# Patient Record
Sex: Female | Born: 1985 | Marital: Single | Smoking: Never smoker
Health system: Southern US, Community
[De-identification: ages and names within clinical notes are randomized; demographics above are authoritative.]

## PROBLEM LIST (undated history)

## (undated) DIAGNOSIS — R51 Headache: Secondary | ICD-10-CM

## (undated) DIAGNOSIS — N390 Urinary tract infection, site not specified: Secondary | ICD-10-CM

## (undated) DIAGNOSIS — R519 Headache, unspecified: Secondary | ICD-10-CM

## (undated) DIAGNOSIS — K5792 Diverticulitis of intestine, part unspecified, without perforation or abscess without bleeding: Secondary | ICD-10-CM

## (undated) HISTORY — DX: Headache, unspecified: R51.9

## (undated) HISTORY — DX: Diverticulitis of intestine, part unspecified, without perforation or abscess without bleeding: K57.92

## (undated) HISTORY — DX: Urinary tract infection, site not specified: N39.0

## (undated) HISTORY — DX: Headache: R51

---

## 2007-02-27 HISTORY — PX: CHOLECYSTECTOMY: SHX55

## 2013-03-24 ENCOUNTER — Ambulatory Visit (INDEPENDENT_AMBULATORY_CARE_PROVIDER_SITE_OTHER): Payer: 59 | Admitting: Physician Assistant

## 2013-03-24 ENCOUNTER — Other Ambulatory Visit (INDEPENDENT_AMBULATORY_CARE_PROVIDER_SITE_OTHER): Payer: 59

## 2013-03-24 ENCOUNTER — Encounter: Payer: Self-pay | Admitting: Physician Assistant

## 2013-03-24 ENCOUNTER — Telehealth: Payer: Self-pay

## 2013-03-24 VITALS — BP 130/70 | HR 72 | Temp 98.0°F | Resp 18 | Ht 65.0 in | Wt 187.4 lb

## 2013-03-24 DIAGNOSIS — Z0000 Encounter for general adult medical examination without abnormal findings: Secondary | ICD-10-CM

## 2013-03-24 LAB — CBC WITH DIFFERENTIAL/PLATELET
BASOS PCT: 0.4 % (ref 0.0–3.0)
Basophils Absolute: 0 10*3/uL (ref 0.0–0.1)
EOS PCT: 0.5 % (ref 0.0–5.0)
Eosinophils Absolute: 0 10*3/uL (ref 0.0–0.7)
HCT: 42.9 % (ref 36.0–46.0)
Hemoglobin: 14.6 g/dL (ref 12.0–15.0)
LYMPHS PCT: 27.7 % (ref 12.0–46.0)
Lymphs Abs: 1.3 10*3/uL (ref 0.7–4.0)
MCHC: 34.1 g/dL (ref 30.0–36.0)
MCV: 88.6 fl (ref 78.0–100.0)
Monocytes Absolute: 0.3 10*3/uL (ref 0.1–1.0)
Monocytes Relative: 5.9 % (ref 3.0–12.0)
NEUTROS PCT: 65.5 % (ref 43.0–77.0)
Neutro Abs: 3.2 10*3/uL (ref 1.4–7.7)
PLATELETS: 327 10*3/uL (ref 150.0–400.0)
RBC: 4.84 Mil/uL (ref 3.87–5.11)
RDW: 11.8 % (ref 11.5–14.6)
WBC: 4.9 10*3/uL (ref 4.5–10.5)

## 2013-03-24 LAB — HEPATIC FUNCTION PANEL
ALBUMIN: 4.4 g/dL (ref 3.5–5.2)
ALT: 16 U/L (ref 0–35)
AST: 17 U/L (ref 0–37)
Alkaline Phosphatase: 50 U/L (ref 39–117)
Bilirubin, Direct: 0.1 mg/dL (ref 0.0–0.3)
TOTAL PROTEIN: 7.8 g/dL (ref 6.0–8.3)
Total Bilirubin: 1.1 mg/dL (ref 0.3–1.2)

## 2013-03-24 LAB — URINALYSIS, ROUTINE W REFLEX MICROSCOPIC
Bilirubin Urine: NEGATIVE
Ketones, ur: NEGATIVE
Leukocytes, UA: NEGATIVE
Nitrite: NEGATIVE
RBC / HPF: NONE SEEN (ref 0–?)
SPECIFIC GRAVITY, URINE: 1.025 (ref 1.000–1.030)
TOTAL PROTEIN, URINE-UPE24: NEGATIVE
UROBILINOGEN UA: 0.2 (ref 0.0–1.0)
Urine Glucose: NEGATIVE
pH: 6 (ref 5.0–8.0)

## 2013-03-24 LAB — BASIC METABOLIC PANEL
BUN: 10 mg/dL (ref 6–23)
CO2: 26 mEq/L (ref 19–32)
Calcium: 9.5 mg/dL (ref 8.4–10.5)
Chloride: 106 mEq/L (ref 96–112)
Creatinine, Ser: 0.7 mg/dL (ref 0.4–1.2)
GFR: 103.97 mL/min (ref >60.00)
GLUCOSE: 91 mg/dL (ref 70–99)
Potassium: 4.5 mEq/L (ref 3.5–5.1)
Sodium: 138 mEq/L (ref 135–145)

## 2013-03-24 LAB — LIPID PANEL
Cholesterol: 152 mg/dL (ref 0–200)
HDL: 40.2 mg/dL (ref >39.00)
LDL Cholesterol: 81 mg/dL (ref 0–99)
Total CHOL/HDL Ratio: 4
Triglycerides: 156 mg/dL — ABNORMAL HIGH (ref 0.0–149.0)
VLDL: 31.2 mg/dL (ref 0.0–40.0)

## 2013-03-24 LAB — TSH: TSH: 1.38 u[IU]/mL (ref 0.35–5.50)

## 2013-03-24 NOTE — Telephone Encounter (Signed)
Message copied by Basilia JumboALLEN, Isiac Breighner on Tue Mar 24, 2013  1:44 PM ------      Message from: Ascencion DikeHARTMAN, NANCY K      Created: Tue Mar 24, 2013  1:19 PM       Please phone patient and inform her all labs normal. ------

## 2013-03-24 NOTE — Progress Notes (Signed)
Pre-visit discussion using our clinic review tool. No additional management support is needed unless otherwise documented below in the visit note.  

## 2013-03-24 NOTE — Progress Notes (Signed)
   Patient ID: Linda FreestoneKendall Devos is a 28 y.o. female DOB: [id number] MRN: [id number]     HPI patient is a 28 year old female who presents to the clinic to establish care and have physical exam. 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  Reports generally in good health. States diagnosed with diverticulitis in 2009 with occasional flare ups, maybe 3-4 a year. Also reports history of intermittent headaches which respond well to Ibuprofen. No current concerns. Denies chest pain/palpitations, cough, SOB, fever, N/V, change in bowel/bladder habits, visual change/disturbances, numbness or weakness.  ROS: Negative for N/V, diarrhea, constipation, blood in stool/urine, chest pain/palpitations, SOB, cough, fever or fatigue. All other systems negative  PE: CONSTITUTIONAL: Well developed, well nourished, pleasant, appears stated age, in NAD HEENT: normocephalic, atraumatic, bilateral ext/int canals normal. Bilateral TM's without injections, bulging, erythema. Nose normal, uvula midline, oropharynx clear and moist. EYES: PERRLA, bilateral EOM and conjunctiva normal NECK: FROM, supple, without thyromegaly or mass CARDIO: RRR, normal S1 and S2, distal pulses intact. PULM/CHEST CTA bilateral, no wheezes, rales or rhonchi. Non tender. ABD: appearance normal, soft, nontender. Normal bowel sounds x 4 quadrants GU: deferred to GYN MUSC: FROM U/LE bilateral LYMPH: no cervical, supraclavicular adenopathy NEURO: alert and oriented x 3, no cranial nerve deficit, motor strength and coordination NL. Negative romberg. Gait normal. SKIN: warm, dry, no rash or lesions noted. PSYCH: Mood and affect normal, speech normal.  Past Medical History  Diagnosis Date  . Diverticulitis   . HA (headache)   . UTI (urinary tract infection)    Family History  Problem Relation Age of Onset  . Arthritis Mother   . Arthritis Father   . Hyperlipidemia Father   . Heart disease Father   . Stroke Father   .  Hypertension Father   . Kidney disease Father   . Diabetes Father   . Cancer Paternal Grandmother     breast   History   Social History  . Marital Status: Single    Spouse Name: N/A    Number of Children: N/A  . Years of Education: N/A   Social History Main Topics  . Smoking status: Never Smoker   . Smokeless tobacco: None  . Alcohol Use: .5 - 1.5 oz/week    1-3 drink(s) per week     Comment: Social  . Drug Use: No  . Sexual Activity: Yes    Partners: Male    Birth Control/ Protection: Implant   Other Topics Concern  . None   Social History Narrative  . None   Past Surgical History  Procedure Laterality Date  . Cholecystectomy  2009    ASSESSMENT and PLAN   CPX/v70.0 - Patient has been counseled on age-appropriate routine health concerns for screening and prevention. These are reviewed and up-to-date. Immunizations are up-to-date or declined. Labs ordered and will be reviewed.  Patient had concerns for intermittent sinus drainage and ear pressure, currently without symptoms, discussed continued use of Zyrtec and flonase.

## 2013-03-24 NOTE — Patient Instructions (Signed)
It was great meeting you today Linda Dixon!  Labs have been ordered for you, when you report to lab please be fasting.   Health Maintenance, Female A healthy lifestyle and preventative care can promote health and wellness.  Maintain regular health, dental, and eye exams.  Eat a healthy diet. Foods like vegetables, fruits, whole grains, low-fat dairy products, and lean protein foods contain the nutrients you need without too many calories. Decrease your intake of foods high in solid fats, added sugars, and salt. Get information about a proper diet from your caregiver, if necessary.  Regular physical exercise is one of the most important things you can do for your health. Most adults should get at least 150 minutes of moderate-intensity exercise (any activity that increases your heart rate and causes you to sweat) each week. In addition, most adults need muscle-strengthening exercises on 2 or more days a week.   Maintain a healthy weight. The body mass index (BMI) is a screening tool to identify possible weight problems. It provides an estimate of body fat based on height and weight. Your caregiver can help determine your BMI, and can help you achieve or maintain a healthy weight. For adults 20 years and older:  A BMI below 18.5 is considered underweight.  A BMI of 18.5 to 24.9 is normal.  A BMI of 25 to 29.9 is considered overweight.  A BMI of 30 and above is considered obese.  Maintain normal blood lipids and cholesterol by exercising and minimizing your intake of saturated fat. Eat a balanced diet with plenty of fruits and vegetables. Blood tests for lipids and cholesterol should begin at age 37 and be repeated every 5 years. If your lipid or cholesterol levels are high, you are over 50, or you are a high risk for heart disease, you may need your cholesterol levels checked more frequently.Ongoing high lipid and cholesterol levels should be treated with medicines if diet and exercise are not  effective.  If you smoke, find out from your caregiver how to quit. If you do not use tobacco, do not start.  Lung cancer screening is recommended for adults aged 43 80 years who are at high risk for developing lung cancer because of a history of smoking. Yearly low-dose computed tomography (CT) is recommended for people who have at least a 30-pack-year history of smoking and are a current smoker or have quit within the past 15 years. A pack year of smoking is smoking an average of 1 pack of cigarettes a day for 1 year (for example: 1 pack a day for 30 years or 2 packs a day for 15 years). Yearly screening should continue until the smoker has stopped smoking for at least 15 years. Yearly screening should also be stopped for people who develop a health problem that would prevent them from having lung cancer treatment.  If you are pregnant, do not drink alcohol. If you are breastfeeding, be very cautious about drinking alcohol. If you are not pregnant and choose to drink alcohol, do not exceed 1 drink per day. One drink is considered to be 12 ounces (355 mL) of beer, 5 ounces (148 mL) of wine, or 1.5 ounces (44 mL) of liquor.  Avoid use of street drugs. Do not share needles with anyone. Ask for help if you need support or instructions about stopping the use of drugs.  High blood pressure causes heart disease and increases the risk of stroke. Blood pressure should be checked at least every 1 to  2 years. Ongoing high blood pressure should be treated with medicines, if weight loss and exercise are not effective.  If you are 74 to 27 years old, ask your caregiver if you should take aspirin to prevent strokes.  Diabetes screening involves taking a blood sample to check your fasting blood sugar level. This should be done once every 3 years, after age 62, if you are within normal weight and without risk factors for diabetes. Testing should be considered at a younger age or be carried out more frequently if you  are overweight and have at least 1 risk factor for diabetes.  Breast cancer screening is essential preventative care for women. You should practice "breast self-awareness." This means understanding the normal appearance and feel of your breasts and may include breast self-examination. Any changes detected, no matter how small, should be reported to a caregiver. Women in their 70s and 30s should have a clinical breast exam (CBE) by a caregiver as part of a regular health exam every 1 to 3 years. After age 47, women should have a CBE every year. Starting at age 42, women should consider having a mammogram (breast X-ray) every year. Women who have a family history of breast cancer should talk to their caregiver about genetic screening. Women at a high risk of breast cancer should talk to their caregiver about having an MRI and a mammogram every year.  Breast cancer gene (BRCA)-related cancer risk assessment is recommended for women who have family members with BRCA-related cancers. BRCA-related cancers include breast, ovarian, tubal, and peritoneal cancers. Having family members with these cancers may be associated with an increased risk for harmful changes (mutations) in the breast cancer genes BRCA1 and BRCA2. Results of the assessment will determine the need for genetic counseling and BRCA1 and BRCA2 testing.  The Pap test is a screening test for cervical cancer. Women should have a Pap test starting at age 58. Between ages 14 and 63, Pap tests should be repeated every 2 years. Beginning at age 18, you should have a Pap test every 3 years as long as the past 3 Pap tests have been normal. If you had a hysterectomy for a problem that was not cancer or a condition that could lead to cancer, then you no longer need Pap tests. If you are between ages 48 and 67, and you have had normal Pap tests going back 10 years, you no longer need Pap tests. If you have had past treatment for cervical cancer or a condition that  could lead to cancer, you need Pap tests and screening for cancer for at least 20 years after your treatment. If Pap tests have been discontinued, risk factors (such as a new sexual partner) need to be reassessed to determine if screening should be resumed. Some women have medical problems that increase the chance of getting cervical cancer. In these cases, your caregiver may recommend more frequent screening and Pap tests.  The human papillomavirus (HPV) test is an additional test that may be used for cervical cancer screening. The HPV test looks for the virus that can cause the cell changes on the cervix. The cells collected during the Pap test can be tested for HPV. The HPV test could be used to screen women aged 22 years and older, and should be used in women of any age who have unclear Pap test results. After the age of 63, women should have HPV testing at the same frequency as a Pap test.  Colorectal cancer  can be detected and often prevented. Most routine colorectal cancer screening begins at the age of 84 and continues through age 83. However, your caregiver may recommend screening at an earlier age if you have risk factors for colon cancer. On a yearly basis, your caregiver may provide home test kits to check for hidden blood in the stool. Use of a small camera at the end of a tube, to directly examine the colon (sigmoidoscopy or colonoscopy), can detect the earliest forms of colorectal cancer. Talk to your caregiver about this at age 68, when routine screening begins. Direct examination of the colon should be repeated every 5 to 10 years through age 54, unless early forms of pre-cancerous polyps or small growths are found.  Hepatitis C blood testing is recommended for all people born from [birth year] through 1964 and any individual with known risks for hepatitis C.  Practice safe sex. Use condoms and avoid high-risk sexual practices to reduce the spread of sexually transmitted infections (STIs). Sexually  active women aged 98 and younger should be checked for Chlamydia, which is a common sexually transmitted infection. Older women with new or multiple partners should also be tested for Chlamydia. Testing for other STIs is recommended if you are sexually active and at increased risk.  Osteoporosis is a disease in which the bones lose minerals and strength with aging. This can result in serious bone fractures. The risk of osteoporosis can be identified using a bone density scan. Women ages 67 and over and women at risk for fractures or osteoporosis should discuss screening with their caregivers. Ask your caregiver whether you should be taking a calcium supplement or vitamin D to reduce the rate of osteoporosis.  Menopause can be associated with physical symptoms and risks. Hormone replacement therapy is available to decrease symptoms and risks. You should talk to your caregiver about whether hormone replacement therapy is right for you.  Use sunscreen. Apply sunscreen liberally and repeatedly throughout the day. You should seek shade when your shadow is shorter than you. Protect yourself by wearing long sleeves, pants, a wide-brimmed hat, and sunglasses year round, whenever you are outdoors.  Notify your caregiver of new moles or changes in moles, especially if there is a change in shape or color. Also notify your caregiver if a mole is larger than the size of a pencil eraser.  Stay current with your immunizations. Document Released: 08/28/2010 Document Revised: 06/09/2012 Document Reviewed: 08/28/2010 [facility] Patient Information 2014 [place].

## 2013-05-12 ENCOUNTER — Other Ambulatory Visit (INDEPENDENT_AMBULATORY_CARE_PROVIDER_SITE_OTHER): Payer: 59

## 2013-05-12 ENCOUNTER — Encounter: Payer: Self-pay | Admitting: Family Medicine

## 2013-05-12 ENCOUNTER — Ambulatory Visit (INDEPENDENT_AMBULATORY_CARE_PROVIDER_SITE_OTHER): Payer: 59 | Admitting: Family Medicine

## 2013-05-12 VITALS — BP 108/80 | HR 80

## 2013-05-12 DIAGNOSIS — M25519 Pain in unspecified shoulder: Secondary | ICD-10-CM

## 2013-05-12 DIAGNOSIS — M25511 Pain in right shoulder: Secondary | ICD-10-CM

## 2013-05-12 DIAGNOSIS — M75100 Unspecified rotator cuff tear or rupture of unspecified shoulder, not specified as traumatic: Secondary | ICD-10-CM

## 2013-05-12 DIAGNOSIS — IMO0002 Reserved for concepts with insufficient information to code with codable children: Secondary | ICD-10-CM

## 2013-05-12 DIAGNOSIS — M7550 Bursitis of unspecified shoulder: Secondary | ICD-10-CM | POA: Insufficient documentation

## 2013-05-12 MED ORDER — MELOXICAM 15 MG PO TABS: 15.0000 mg | ORAL_TABLET | Freq: Every day | ORAL | Status: DC

## 2013-05-12 NOTE — Progress Notes (Signed)
Tawana ScaleZach Dugan Vanhoesen D.O. [facility] [address] [place], [place] [address] Phone: [phone] Subjective:    I'm seeing this patient by the request  of:  Baltazar ApoHartman, Nancy, PA-C   CC: Right shoulder pain  YQM:VHQIONGEXBHPI:Subjective Linda FreestoneKendall Dixon is a 28 y.o. female coming in with complaint of right shoulder pain. 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 Patient states that this is been going on for months. Patient has tried over-the-counter orthotics and icing with minimal benefit. Patient denies any neck pain, denies any radiation of the arm and denies any weakness. Patient states with the pain is very sore though she has trouble with removing her shirt. Patient states that the pain can be 2/10. But can be 10/10 at the end of a long day.      Past medical history, social, surgical and family history all reviewed in electronic medical record.   Review of Systems: No headache, visual changes, nausea, vomiting, diarrhea, constipation, dizziness, abdominal pain, skin rash, fevers, chills, night sweats, weight loss, swollen lymph nodes, body aches, joint swelling, muscle aches, chest pain, shortness of breath, mood changes.   Objective Blood pressure 108/80, pulse 80, SpO2 96.00%.  General: No apparent distress alert and oriented x3 mood and affect normal, dressed appropriately.  HEENT: Pupils equal, extraocular movements intact  Respiratory: Patient's speak in full sentences and does not appear short of breath  Cardiovascular: No lower extremity edema, non tender, no erythema  Skin: Warm dry intact with no signs of infection or rash on extremities or on axial skeleton.  Abdomen: Soft nontender  Neuro: Cranial nerves II through XII are intact, neurovascularly intact in all extremities with 2+ DTRs and 2+ pulses.  Lymph: No lymphadenopathy of posterior or anterior cervical chain or  axillae bilaterally.  Gait normal with good balance and coordination.  MSK:  Non tender with full range of motion and good stability and symmetric strength and tone of  elbows, wrist, hip, knee and ankles bilaterally.  Shoulder: Right Inspection reveals no abnormalities, atrophy or asymmetry. Palpation is normal with no tenderness over AC joint or bicipital groove. ROM is full in all planes. Rotator cuff strength normal throughout.  signs of impingement with positive Neer and Hawkin's tests,  Is negative empty can sign. Speeds and Yergason's tests normal.  labral pathology noted with positive Obrien's, negative clunk and good stability. Normal scapular function observed. No painful arc and no drop arm sign. No apprehension sign Contralateral shoulder unremarkable  MSK US performed of: Right This study was ordered, performed, and interpreted by Terrilee FilesZach Ovie Eastep D.O.  Shoulder:   Supraspinatus:  Tendon appears to be normal the patient does have bursal bulge causing some mild impingement Infraspinatus:  Appears normal on long and transverse views. Subscapularis:  Appears normal on long and transverse views. Teres Minor:  Appears normal on long and transverse views. AC joint:  Capsule undistended, no geyser sign. Glenohumeral Joint:  Appears normal without effusion. Glenoid Labrum:  Intact without visualized tears patient does have some scarring in the area that she seems to be a previous injury Biceps Tendon:  Appears normal on long and transverse views, no fraying of tendon, tendon located in intertubercular groove, no subluxation with shoulder internal or external rotation. No increased power doppler signal. Impression: Subacromial bursitis mild chronic labral degenerative changes      Impression and Recommendations:  This case required medical decision making of moderate complexity.

## 2013-05-12 NOTE — Patient Instructions (Signed)
Good to meet you Start exercises most days of the week.  Ice 20 minutes 1-2 times a day most important after exercises and before bed.  Meloxicam daily for 10 days then as needed Come back in 3-4 weeks

## 2013-05-12 NOTE — Assessment & Plan Note (Signed)
Patient does have some subacromial bursitis and maybe some mild rotator cuff tendinopathy noted. Patient will try conservative therapy with anti-inflammatories, icing, and home exercise program. We discussed the possibility of a nitroglycerin patch versus injection at followup. We will discuss more at further evaluation and 3-4 weeks.

## 2013-05-20 ENCOUNTER — Encounter: Payer: 59 | Admitting: Obstetrics & Gynecology

## 2013-06-09 ENCOUNTER — Encounter: Payer: Self-pay | Admitting: Family Medicine

## 2013-06-09 ENCOUNTER — Ambulatory Visit (INDEPENDENT_AMBULATORY_CARE_PROVIDER_SITE_OTHER): Payer: 59 | Admitting: Family Medicine

## 2013-06-09 VITALS — BP 140/80 | HR 74

## 2013-06-09 DIAGNOSIS — M75100 Unspecified rotator cuff tear or rupture of unspecified shoulder, not specified as traumatic: Secondary | ICD-10-CM

## 2013-06-09 DIAGNOSIS — M7550 Bursitis of unspecified shoulder: Secondary | ICD-10-CM

## 2013-06-09 DIAGNOSIS — IMO0002 Reserved for concepts with insufficient information to code with codable children: Secondary | ICD-10-CM

## 2013-06-09 NOTE — Progress Notes (Signed)
  Tawana ScaleZach Smith D.O. [facility] [address] [place], [place] [address] Phone: [phone]) [phone] Subjective:     CC: Right shoulder pain followup.  RJJ:OACZYSAYTKHPI:Subjective Alexia FreestoneKendall Neyens is a 28 y.o. female coming in with complaint of right shoulder pain. Patient was found to have a subacromial bursitis at last visit. Patient elected to do more of conservative therapy with meloxicam, home exercises and icing. Patient states she is approximately 80% better. Patient states that decrease in the weight and keeping her eyes within the peripheral patient has helped tremendously. Patient states work is also been much easier which has been helpful. Patient states that she is approximately 80-85% better. Denies any nighttime awakening and no longer taking the meloxicam. Patient does state that the meloxicam was very beneficial and stated that she had a soreness after stopping medicine in her whole body. Denies any new symptoms to.    Past medical history, social, surgical and family history all reviewed in electronic medical record.   Review of Systems: No headache, visual changes, nausea, vomiting, diarrhea, constipation, dizziness, abdominal pain, skin rash, fevers, chills, night sweats, weight loss, swollen lymph nodes, body aches, joint swelling, muscle aches, chest pain, shortness of breath, mood changes.   Objective There were no vitals taken for this visit.  General: No apparent distress alert and oriented x3 mood and affect normal, dressed appropriately.  HEENT: Pupils equal, extraocular movements intact  Respiratory: Patient's speak in full sentences and does not appear short of breath  Cardiovascular: No lower extremity edema, non tender, no erythema  Skin: Warm dry intact with no signs of infection or rash on extremities or on axial skeleton.  Abdomen: Soft nontender  Neuro: Cranial nerves II through XII are intact, neurovascularly intact in all extremities with 2+ DTRs and 2+ pulses.  Lymph:  No lymphadenopathy of posterior or anterior cervical chain or axillae bilaterally.  Gait normal with good balance and coordination.  MSK:  Non tender with full range of motion and good stability and symmetric strength and tone of  elbows, wrist, hip, knee and ankles bilaterally.  Shoulder: Right Inspection reveals no abnormalities, atrophy or asymmetry. Palpation is normal with no tenderness over AC joint or bicipital groove. ROM is full in all planes. Rotator cuff strength normal throughout.  mild signs of impingement with positive Neer and Hawkin's tests but improved. ,  Is negative empty can sign. Speeds and Yergason's tests normal.  labral pathology negative Obrien's, negative clunk and good stability. Normal scapular function observed. No painful arc and no drop arm sign. No apprehension sign Contralateral shoulder unremarkable     Impression and Recommendations:     This case required medical decision making of moderate complexity.

## 2013-06-09 NOTE — Patient Instructions (Signed)
Good to see you Exercises 3 times a week for another 6 weeks.  meloxicam when you need it.  If really bad take straight for 3 days no matter what.  Turmeric 500mg  twice daily Vitamin D 2000 IU daily.  Ice still after activity  Come back again in 6 weeks if not perfect.

## 2013-06-09 NOTE — Assessment & Plan Note (Signed)
Patient did do remarkably well with conservative therapy. Discussed over-the-counter medications are to be beneficial at continue the exercises 3 times a week for another 6 weeks. We discussed icing afterwards in the use of meloxicam when needed. Patient will come back in 6 weeks if shoulders not completely better.

## 2013-09-25 ENCOUNTER — Encounter: Payer: Self-pay | Admitting: Family Medicine

## 2013-09-25 ENCOUNTER — Ambulatory Visit (INDEPENDENT_AMBULATORY_CARE_PROVIDER_SITE_OTHER): Payer: 59 | Admitting: Family Medicine

## 2013-09-25 ENCOUNTER — Ambulatory Visit (INDEPENDENT_AMBULATORY_CARE_PROVIDER_SITE_OTHER)
Admission: RE | Admit: 2013-09-25 | Discharge: 2013-09-25 | Disposition: A | Discharge status: Home / Self Care | Payer: 59 | Source: Ambulatory Visit | Attending: Family Medicine | Admitting: Family Medicine

## 2013-09-25 ENCOUNTER — Other Ambulatory Visit (INDEPENDENT_AMBULATORY_CARE_PROVIDER_SITE_OTHER): Payer: 59

## 2013-09-25 VITALS — BP 144/90 | HR 79 | Ht 65.0 in | Wt 189.0 lb

## 2013-09-25 DIAGNOSIS — S92009A Unspecified fracture of unspecified calcaneus, initial encounter for closed fracture: Secondary | ICD-10-CM | POA: Insufficient documentation

## 2013-09-25 DIAGNOSIS — M79671 Pain in right foot: Secondary | ICD-10-CM

## 2013-09-25 DIAGNOSIS — S92001A Unspecified fracture of right calcaneus, initial encounter for closed fracture: Secondary | ICD-10-CM

## 2013-09-25 DIAGNOSIS — M79609 Pain in unspecified limb: Secondary | ICD-10-CM

## 2013-09-25 NOTE — Assessment & Plan Note (Signed)
Patient does have a calcaneal stress reaction occurring on ultrasound today. [facility] and will be wearing it for the next 2-3 weeks. We discussed icing protocol as well as vitamin D supplementation. Patient will get x-rays to rule out larger fracture that is not seen on ultrasound. Patient will come back again in 3 weeks for further evaluation and treatment.  Spent greater than 25 minutes with patient face-to-face and had greater than 50% of counseling including as described above in assessment and plan.

## 2013-09-25 NOTE — Progress Notes (Signed)
[facility] [address] [place], [place] [address] Phone: [phone] Subjective:     CC: Right heel pain  OZH:[id number] Linda Dixon is a 28 y.o. female coming in with complaint of right heel pain. Patient states it started approximately 3 weeks ago. Patient stopped running for 2 weeks and then attempted to run again and had severe pain. Now it hurts when she was ambulates. Patient states even during her daily activities it is very painful. Patient states that the pain is 7/10 and can even hurt with standing. Denies though any pain with rest. Has not improved with ambulation. Patient has tried some meloxicam with minimal improvement. Denies any posterior heel pain is only on the plantar aspect of the heel.     Past medical history, social, surgical and family history all reviewed in electronic medical record.   Review of Systems: No headache, visual changes, nausea, vomiting, diarrhea, constipation, dizziness, abdominal pain, skin rash, fevers, chills, night sweats, weight loss, swollen lymph nodes, body aches, joint swelling, muscle aches, chest pain, shortness of breath, mood changes.   Objective Blood pressure 144/90, pulse 79, height 5\' 5"  (1.651 m), weight 189 lb (85.73 kg), last menstrual period 09/25/2013, SpO2 97.00%.  General: No apparent distress alert and oriented x3 mood and affect normal, dressed appropriately.  HEENT: Pupils equal, extraocular movements intact  Respiratory: Patient's speak in full sentences and does not appear short of breath  Cardiovascular: No lower extremity edema, non tender, no erythema  Skin: Warm dry intact with no signs of infection or rash on extremities or on axial skeleton.  Abdomen: Soft nontender  Neuro: Cranial nerves II through XII are intact, neurovascularly intact in all extremities with 2+ DTRs and 2+ pulses.  Lymph: No lymphadenopathy of posterior or anterior cervical chain or axillae bilaterally.  Gait  normal with good balance and coordination.  MSK:  Non tender with full range of motion and good stability and symmetric strength and tone of shoulders, elbows, wrist, hip, knees bilaterally.  Ankle: Right No visible erythema or swelling. Range of motion is full in all directions. Strength is 5/5 in all directions. Stable lateral and medial ligaments; squeeze test and kleiger test unremarkable; Talar dome nontender; No pain at base of 5th MT; No tenderness over cuboid; No tenderness over N spot or navicular prominence No tenderness on posterior aspects of lateral and medial malleolus No sign of peroneal tendon subluxations or tenderness to palpation Negative tarsal tunnel tinel's Able to walk 4 steps. Patient is severely tender to palpation over the calcaneal portion on the plantar aspect. No pain over the Achilles. Contralateral ankle unremarkable  MSK US performed of: Right ankle This study was ordered, performed, and interpreted by Terrilee Files D.O.  Foot/Ankle:   All structures visualized.   Talar dome unremarkable  Ankle mortise without effusion. Peroneus longus and brevis tendons unremarkable on long and transverse views without sheath effusions. Posterior tibialis, flexor hallucis longus, and flexor digitorum longus tendons unremarkable on long and transverse views without sheath effusions. Achilles tendon visualized along length of tendon and unremarkable on long and transverse views without sheath effusion. Anterior Talofibular Ligament and Calcaneofibular Ligaments unremarkable and intact. Deltoid Ligament unremarkable and intact. Plantar fascia intact and without effusion, normal thickness. No increased doppler signal, cap sign, or thickening of tibial cortex. Patient does have what appears to be a fairly large cortical defect with in the calcaneus. This is on the plantar aspect. Hypoechoic changes noted. No increasing Doppler flow  though noted.  IMPRESSION: Questionable  calcaneal stress fracture      Impression and Recommendations:     This case required medical decision making of moderate complexity.

## 2013-09-25 NOTE — Patient Instructions (Signed)
Great to see you Wear boot all the time except sleeping and driving.  Ice bath 20 minutes at end of day.  Vitamin D 2000 IU daily  Come out and move ankle at least daily.  xrays downstairs today.  Meloxicam is ok.  Come back in 2-3 week sto make sure doing better.

## 2013-10-06 ENCOUNTER — Ambulatory Visit: Payer: 59 | Admitting: Family Medicine

## 2013-10-13 ENCOUNTER — Ambulatory Visit: Payer: 59 | Admitting: Family Medicine

## 2013-10-13 ENCOUNTER — Other Ambulatory Visit: Payer: 59

## 2013-10-13 ENCOUNTER — Ambulatory Visit (INDEPENDENT_AMBULATORY_CARE_PROVIDER_SITE_OTHER): Payer: 59 | Admitting: Family Medicine

## 2013-10-13 ENCOUNTER — Encounter: Payer: Self-pay | Admitting: Family Medicine

## 2013-10-13 VITALS — BP 136/82 | HR 71 | Ht 65.0 in | Wt 189.0 lb

## 2013-10-13 VITALS — Ht 65.0 in | Wt 183.6 lb

## 2013-10-13 DIAGNOSIS — E669 Obesity, unspecified: Secondary | ICD-10-CM

## 2013-10-13 DIAGNOSIS — S92001A Unspecified fracture of right calcaneus, initial encounter for closed fracture: Secondary | ICD-10-CM

## 2013-10-13 DIAGNOSIS — S92009A Unspecified fracture of unspecified calcaneus, initial encounter for closed fracture: Secondary | ICD-10-CM

## 2013-10-13 MED ORDER — NITROGLYCERIN 0.2 MG/HR TD PT24: MEDICATED_PATCH | TRANSDERMAL | Status: AC

## 2013-10-13 NOTE — Progress Notes (Signed)
  [facility] Scale[facility]ach Smith D.O. [facility] [address] [place], [place] [address] Phone: [phone] Subjective:     CC: Right heel pain  ZHY:QMVHQIONGEHPI:Subjective Linda FreestoneKendall Dixon is a 28 y.o. female coming in with complaint of right heel pain. Patient was found to have a continuous stress fracture. Patient has been in a [employer], anti-inflammatories orally, as well as an icing protocol. Patient states that she is approximately 70% better. Has not been walking without her But. Patient denies any new symptoms. Overall is feeling better. And continues to bike but has not been able to run.    Past medical history, social, surgical and family history all reviewed in electronic medical record.   Review of Systems: No headache, visual changes, nausea, vomiting, diarrhea, constipation, dizziness, abdominal pain, skin rash, fevers, chills, night sweats, weight loss, swollen lymph nodes, body aches, joint swelling, muscle aches, chest pain, shortness of breath, mood changes.   Objective Blood pressure 136/82, pulse 71, height 5\' 5"  (1.651 m), weight 189 lb (85.73 kg), last menstrual period 09/25/2013, SpO2 98.00%.  General: No apparent distress alert and oriented x3 mood and affect normal, dressed appropriately.  HEENT: Pupils equal, extraocular movements intact  Respiratory: Patient's speak in full sentences and does not appear short of breath  Cardiovascular: No lower extremity edema, non tender, no erythema  Skin: Warm dry intact with no signs of infection or rash on extremities or on axial skeleton.  Abdomen: Soft nontender  Neuro: Cranial nerves II through XII are intact, neurovascularly intact in all extremities with 2+ DTRs and 2+ pulses.  Lymph: No lymphadenopathy of posterior or anterior cervical chain or axillae bilaterally.  Gait normal with good balance and coordination.  MSK:  Non tender with full range of motion and good stability and symmetric strength and tone of shoulders, elbows, wrist,  hip, knees bilaterally.  Ankle: Right No visible erythema or swelling. Range of motion is full in all directions. Strength is 5/5 in all directions. Stable lateral and medial ligaments; squeeze test and kleiger test unremarkable; Talar dome nontender; No pain at base of 5th MT; No tenderness over cuboid; No tenderness over N spot or navicular prominence No tenderness on posterior aspects of lateral and medial malleolus No sign of peroneal tendon subluxations or tenderness to palpation Negative tarsal tunnel tinel's Able to walk 4 steps. Patient is a moderately tender to palpation over the calcaneal portion on the plantar aspect. No pain over the Achilles. Contralateral ankle unremarkable  MSK US performed of: Right ankle This study was ordered, performed, and interpreted by Terrilee FilesZach Smith D.O.  Foot/Ankle:   All structures visualized.   Talar dome unremarkable  Ankle mortise without effusion. Peroneus longus and brevis tendons unremarkable on long and transverse views without sheath effusions. Posterior tibialis, flexor hallucis longus, and flexor digitorum longus tendons unremarkable on long and transverse views without sheath effusions. Achilles tendon visualized along length of tendon and unremarkable on long and transverse views without sheath effusion. Anterior Talofibular Ligament and Calcaneofibular Ligaments unremarkable and intact. Deltoid Ligament unremarkable and intact. Plantar fascia intact and without effusion, normal thickness. No increased doppler signal, cap sign, or thickening of tibial cortex. Patient calcaneal cortical defect is almost healed at this time. Patient does have good callus formation. Decreased Doppler flow though.  IMPRESSION: Healing calcaneal stress fracture      Impression and Recommendations:     This case required medical decision making of moderate complexity.

## 2013-10-13 NOTE — Progress Notes (Signed)
Medical Nutrition Therapy:  Appt start time: 1000 end time:  1100.  Assessment:  Primary concerns today: Weight management.  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  She does not enjoy cooking, so finds she does not always make the best food choices.  She lost 55 lb thru diet (kcal counting w/ MyFitnessPal) in 2010, then gained ~20 lb back during grad school ([employer]) and exercise.    Learning Readiness:   Ready; getting married next yr, so is motivated for weight loss, but is now dealing with a stress fracture of her right heel, so exercise is limited.    Barriers to learning/adherence to lifestyle change: Living alone, feeling depressed, from both recent injury and being away from fiance and family, who are in [place].    Usual eating pattern includes 3 meals and 2-3 snacks per day. Usual physical activity includes none currently due to R heel stress frx.  She is able to swim or ride a stat bike, but has not been motivated to do so.    Frequent foods include breakfast of [facility] granola bar (140 kcal) with pb OR banana and pb; La[employer]ix waters, (diet soda 1-2 X wk), 1 c coffee w/ 2 tbsp l-f creamer, chx & chs or chx salad sandwich for lunch.  Avoided foods include milk (lactose intol), fish, seafood, (likes veg's, but seldom eats).    24-hr recall: (Up at 5:30 AM) B (8:15 AM)-   1 banana, 2 tbsp peanut butter, coffee w/ 2 tbsp l-f creamer Snk (11 AM)-   1/4 c Powerberries (dark choc w/ fruit juices) (160 kcal)  L (12:40 PM)-  1 baggie potato chips, chx salad sandw, [employer] water Snk (4 PM)-  1 [employer] cc cookie D (6:30 PM)-  1-2 c chips, 1/2 c salsa, water Snk ( PM)-  none VF CorporationKendall estimates she currently gets a serving of veg's ~2 X wk.    Progress Towards Goal(s):  In progress.   Nutritional Diagnosis:  NB-2.1 Physical inactivity As related to poor motivation.  As evidenced by no regular exercise currently.    Intervention:  Nutrition education.  Handouts given  during visit include:  AVS  Goals sheet  Demonstrated degree of understanding via:  Teach Back   Monitoring/Evaluation:  Dietary intake, exercise, and body weight in 6 week(s).

## 2013-10-13 NOTE — Patient Instructions (Addendum)
-   Call to inquire about fees and schedule:  [facility]:  [phone]. - Goals:  1. Stationary bike 30-60 min 5 X wk. (Check out [employer] Timer app.)  2. Plan REAL meals once a week.  Use as a basis for shopping, and plan which days you will be doing some food prep.     - A REAL meal = include a protein source, vegetables, and starch.  (Obtain twice as many veg's as protein or carbohydrate foods for both lunch and dinner.)   - Get yourself a couple of nice pasta bowls to use for entree salads.    - AT FOLLOW-UP, WE'LL TALK ABOUT BREAKFAST; YOU PROBABLY NEED MORE PROTEIN & KCAL.

## 2013-10-13 NOTE — Patient Instructions (Signed)
Good to see you You are doing better! Continue the ice Continue the vitamin D  Boot until we call you for the new brace.  Nitroglycerin Protocol   Apply 1/4 nitroglycerin patch to affected area daily.  Change position of patch within the affected area every 24 hours.  You may experience a headache during the first 1-2 weeks of using the patch, these should subside.  If you experience headaches after beginning nitroglycerin patch treatment, you may take your preferred over the counter pain reliever.  Another side effect of the nitroglycerin patch is skin irritation or rash related to patch adhesive.  Please notify our office if you develop more severe headaches or rash, and stop the patch.  Tendon healing with nitroglycerin patch may require 12 to 24 weeks depending on the extent of injury.  Men should not use if taking Viagra, Cialis, or Levitra.   Do not use if you have migraines or rosacea.   Come back in 3 weeks to see how you are doing.

## 2013-10-13 NOTE — Assessment & Plan Note (Signed)
Patient stress reaction seems to be healing overall. We discussed continuing the boot for another week and then transition into an air compression heel brace. There also start patient on nitroglycerin to try to improve healing. Patient will try these interventions and come back again in 3-4 weeks for further evaluatio We'll continue with the conservative therapy.

## 2013-11-03 ENCOUNTER — Ambulatory Visit: Payer: 59 | Admitting: Family Medicine

## 2013-11-10 ENCOUNTER — Ambulatory Visit: Payer: 59 | Admitting: Family Medicine

## 2013-11-24 ENCOUNTER — Ambulatory Visit: Payer: 59 | Admitting: Family Medicine

## 2013-12-01 ENCOUNTER — Ambulatory Visit (INDEPENDENT_AMBULATORY_CARE_PROVIDER_SITE_OTHER): Payer: 59 | Admitting: Internal Medicine

## 2013-12-01 ENCOUNTER — Encounter: Payer: Self-pay | Admitting: Internal Medicine

## 2013-12-01 VITALS — BP 112/80 | HR 95 | Temp 98.7°F | Resp 14 | Wt 196.2 lb

## 2013-12-01 DIAGNOSIS — J0111 Acute recurrent frontal sinusitis: Secondary | ICD-10-CM

## 2013-12-01 DIAGNOSIS — F32A Depression, unspecified: Secondary | ICD-10-CM

## 2013-12-01 DIAGNOSIS — F329 Major depressive disorder, single episode, unspecified: Secondary | ICD-10-CM

## 2013-12-01 MED ORDER — CLARITHROMYCIN ER 500 MG PO TB24
1000.0000 mg | ORAL_TABLET | Freq: Every day | ORAL | Status: AC
Start: 2013-12-01 — End: ?

## 2013-12-01 MED ORDER — CITALOPRAM HYDROBROMIDE 20 MG PO TABS: 20.0000 mg | ORAL_TABLET | Freq: Every day | ORAL | Status: AC

## 2013-12-01 NOTE — Progress Notes (Signed)
   Subjective:    Patient ID: Linda Dixon, female    DOB: Dec 15, 1985, 28 y.o.   MRN: [id number]  HPI    She is here with 2 issues. The major issue is depression. This has been present 6 months and is in the context of her fiancee living in [place]. Additional pressures include new job and her father's health conditions of congestive heart failure, diabetes, and chronic kidney disease.  There is no family history of mental health disorder or drug/alcohol abuse  She describes anxiety and irritability. She is sleeping excessively. She's eating emotionally and denies anorexia.  Her thyroid has not been checked recently  Her other issue is "sinus" present for 2 years. Exacerbated 4 months ago following a "cold". She has postnasal drainage.  She awoke 10/5 with swollen lymph nodes and congestion.  She describes  frontal sinus pain; posterior neck pain; and nasal purulence  She also has had issues with tonsil stones in the past.    Review of Systems   She denies any extrinsic symptoms. She has no facial sinus pain, dental pain, otic pain, or otic discharge.  She also has no cough, wheezing, or shortness of breath.     Objective:   Physical Exam   Pertinent positive findings include: Affect is somewhat flat but she is oriented and interactive. The right nare reveals a nasal polyp. The remainder of the exam is unremarkable.  General appearance:good health ;well nourished; no acute distress or increased work of breathing is present.  No  lymphadenopathy about the head, neck, or axilla noted.   Eyes: No conjunctival inflammation or lid edema is present. There is no scleral icterus.  Ears:  External ear exam shows no significant lesions or deformities.  Otoscopic examination reveals clear canals, tympanic membranes are intact bilaterally without bulging, retraction, inflammation or discharge.  Nose:  External nasal examination shows no deformity or inflammation. Nasal mucosa are  pink and moist exudates. No septal dislocation or deviation.No obstruction to airflow.   Oral exam: Dental hygiene is good; lips and gums are healthy appearing.There is no oropharyngeal erythema or exudate noted.   Neck:  No deformities, thyromegaly, masses, or tenderness noted.   Supple with full range of motion without pain.   Heart:  Normal rate and regular rhythm. S1 and S2 normal without gallop, murmur, click, rub or other extra sounds.   Lungs:Chest clear to auscultation; no wheezes, rhonchi,rales ,or rubs present.No increased work of breathing.    Extremities:  No cyanosis, edema, or clubbing  noted    Skin: Warm & dry w/o jaundice or tenting.         Assessment & Plan:  #1 depression #2 #1 rhinosinusitis without significant bronchitis  Plan:  The pathophysiology of neurotransmitter deficiency was discussed along with the benefits and potential adverse effects of SSRI therapy.  Nasal hygiene interventions discussed. See prescription medications

## 2013-12-01 NOTE — Patient Instructions (Signed)
As we discussed the neurotransmitters are essential for good brain function, both intellectually & emotionally. The agents to increase the neurotransmitter levels are not addictive and simply keep this essential neurotransmitter at therapeutic levels. If these levels become severely depleted; depression or panic attacks can occur.    Plain Mucinex (NOT D) for thick secretions ;force NON dairy fluids .   Nasal cleansing in the shower as discussed with lather of mild shampoo.After 10 seconds wash off lather while  exhaling through nostrils. Make sure that all residual soap is removed to prevent irritation.  Flonase OR Nasacort AQ 1 spray in each nostril twice a day as needed. Use the "crossover" technique into opposite nostril spraying toward opposite ear @ 45 degree angle, not straight up into nostril.  Use a Neti pot daily only  as needed for significant sinus congestion; going from open side to congested side . Plain Allegra (NOT D )  160 daily , Loratidine 10 mg , OR Zyrtec 10 mg @ bedtime  as needed for itchy eyes & sneezing.  Please consider a water pic to gavage out the impactions in the tonsils. These impactions cause inflammation and possible secondary infection of the tonsil.

## 2013-12-01 NOTE — Progress Notes (Signed)
Pre visit review using our clinic review tool, if applicable. No additional management support is needed unless otherwise documented below in the visit note. 

## 2014-03-30 ENCOUNTER — Encounter: Payer: 59 | Admitting: Internal Medicine

## 2014-03-30 ENCOUNTER — Encounter: Payer: 59 | Admitting: Physician Assistant

## 2014-07-06 ENCOUNTER — Telehealth: Payer: Self-pay | Admitting: Family Medicine

## 2014-07-06 MED ORDER — MELOXICAM 15 MG PO TABS: 15.0000 mg | ORAL_TABLET | Freq: Every day | ORAL | Status: AC | PRN

## 2014-07-06 NOTE — Telephone Encounter (Signed)
Is requesting script for Meloxicam to be sent to [facility] in [place]

## 2014-07-06 NOTE — Telephone Encounter (Signed)
rx sent into pharmacy

## 2016-05-07 IMAGING — CR DG FOOT COMPLETE 3+V*R*
3 series · 3 of 3 positions shown · non-contrast
Comparison: None.

CLINICAL DATA: Foot and heel pain.

EXAM:
RIGHT FOOT COMPLETE - 3+ VIEW

[view not recorded (1 of 3)]
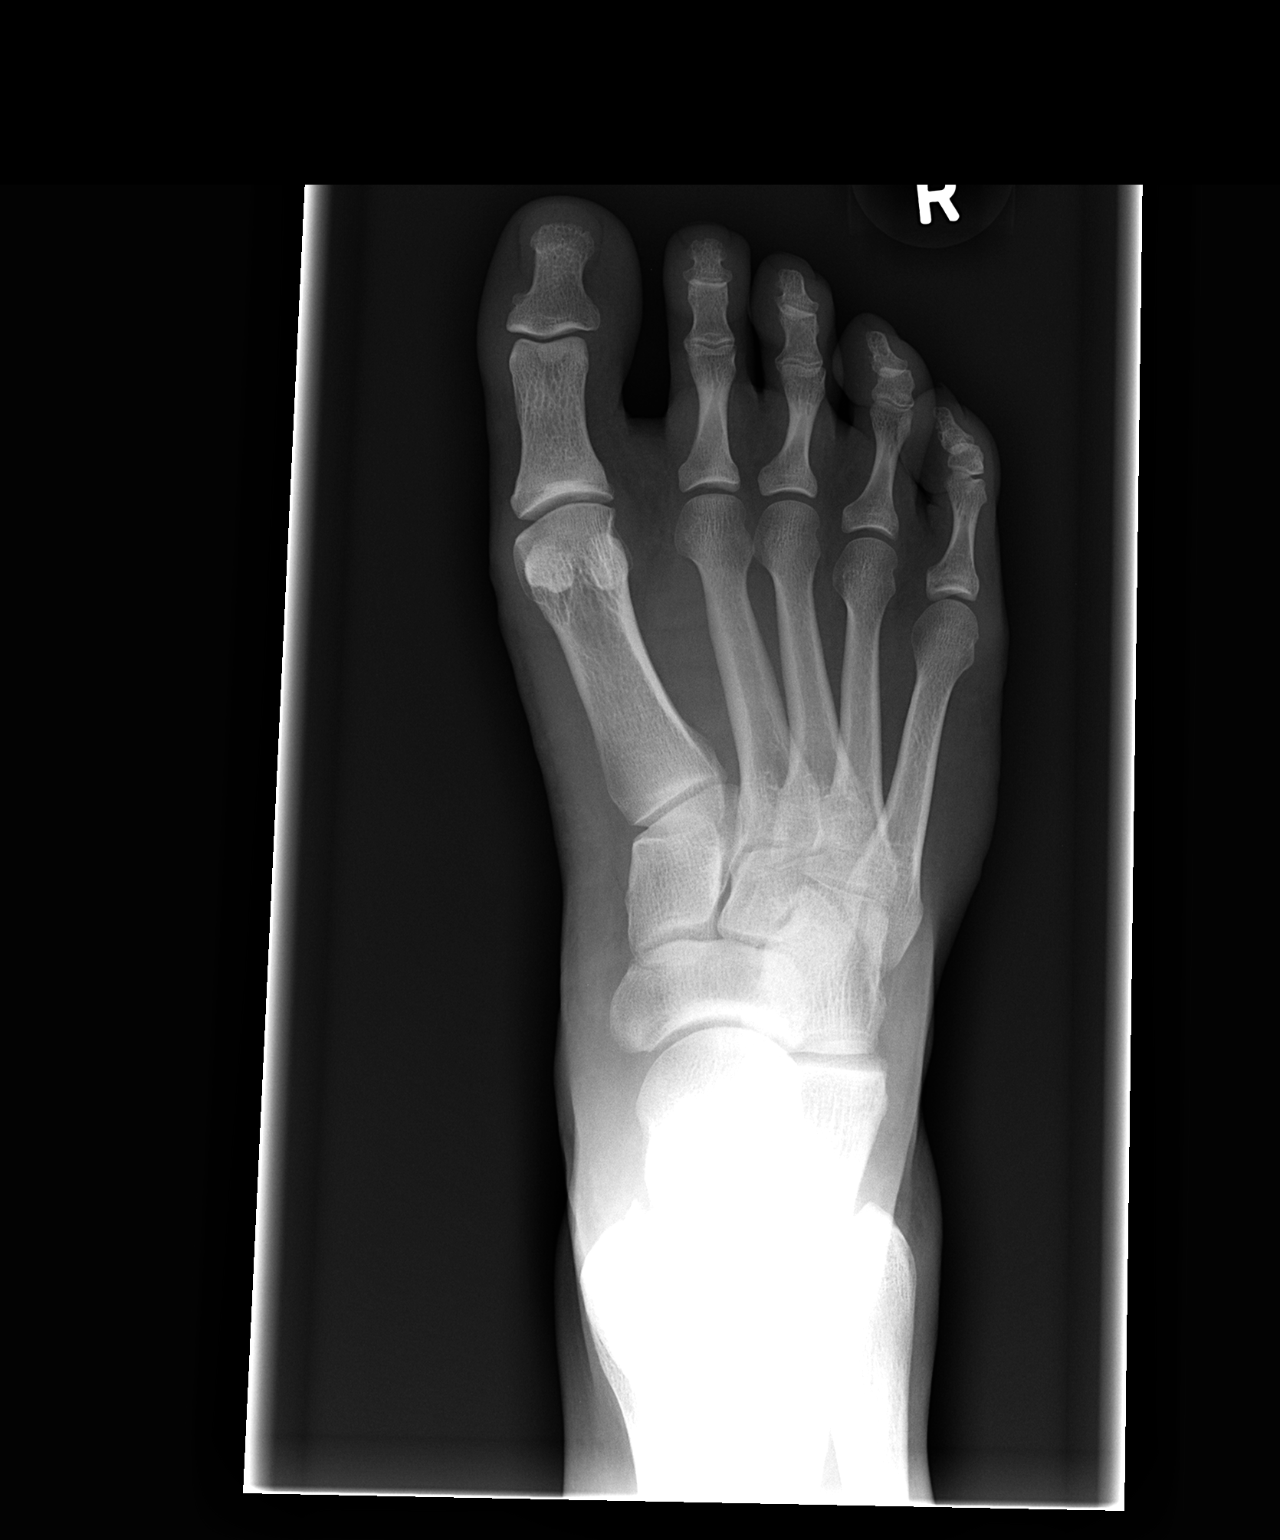

[view not recorded (2 of 3)]
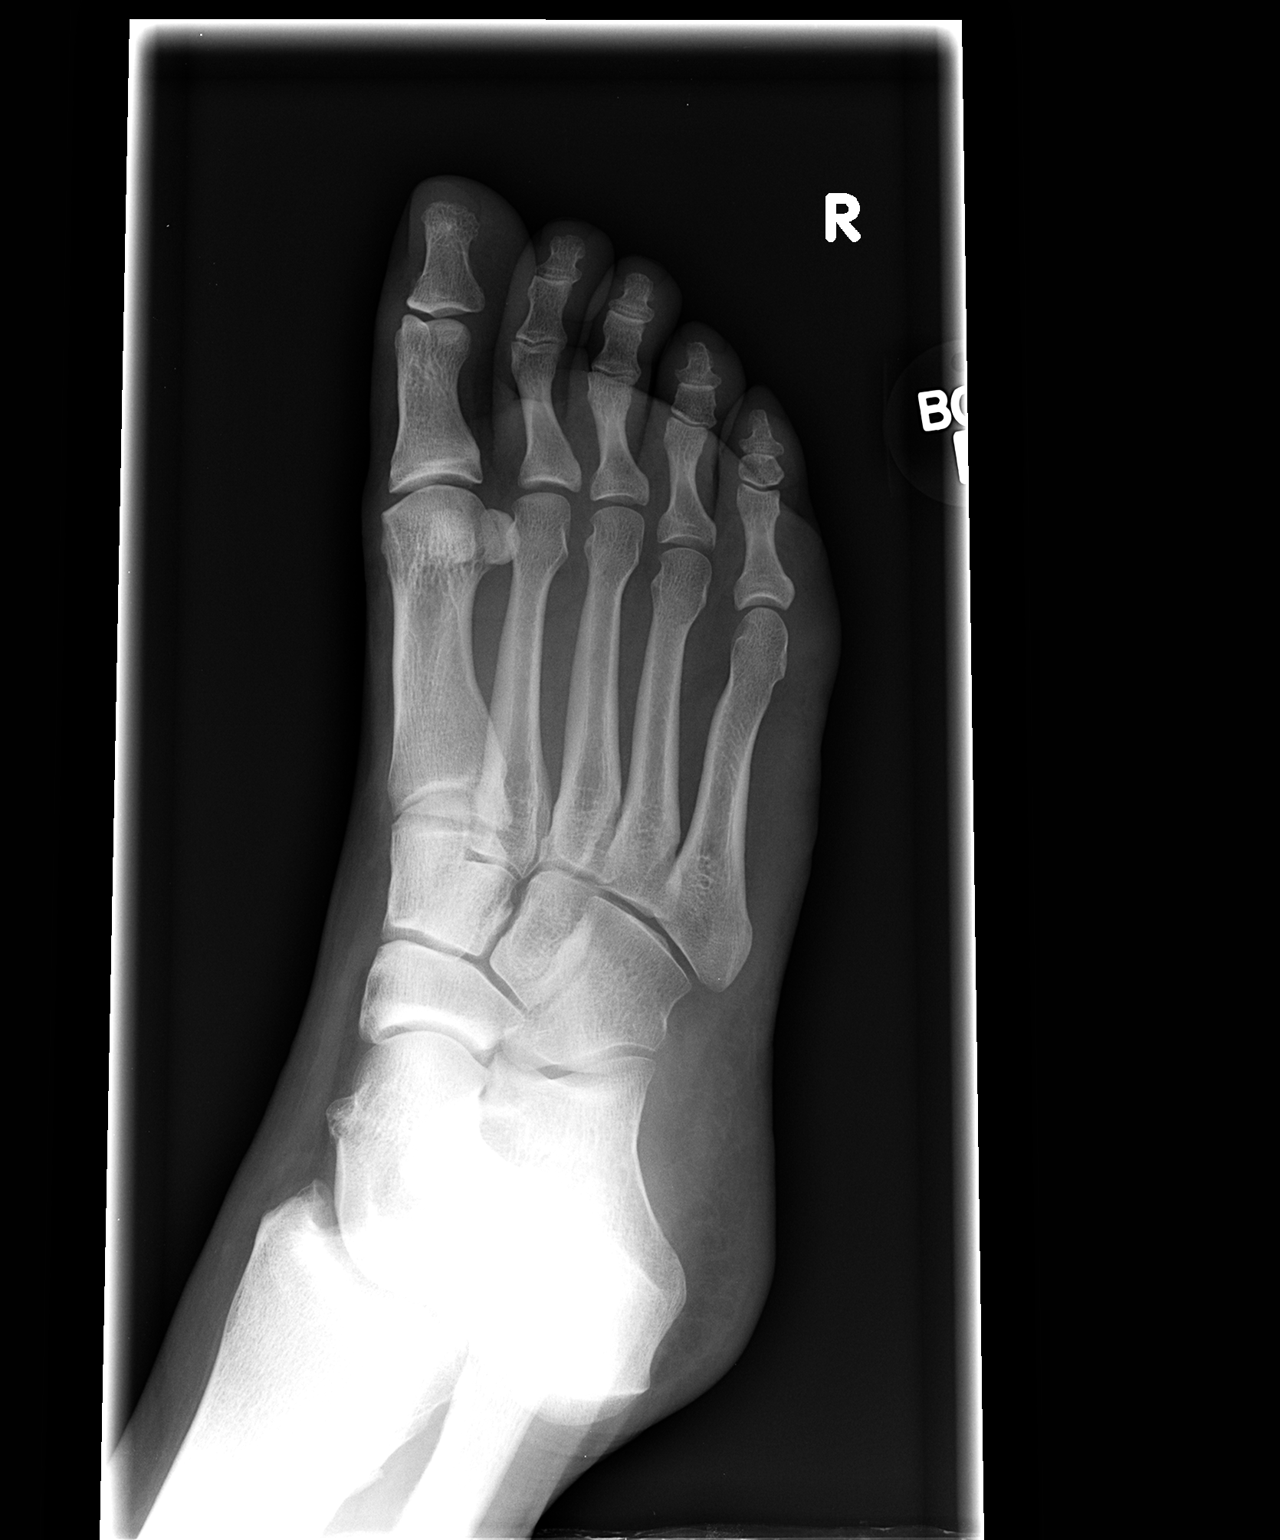

[view not recorded (3 of 3)]
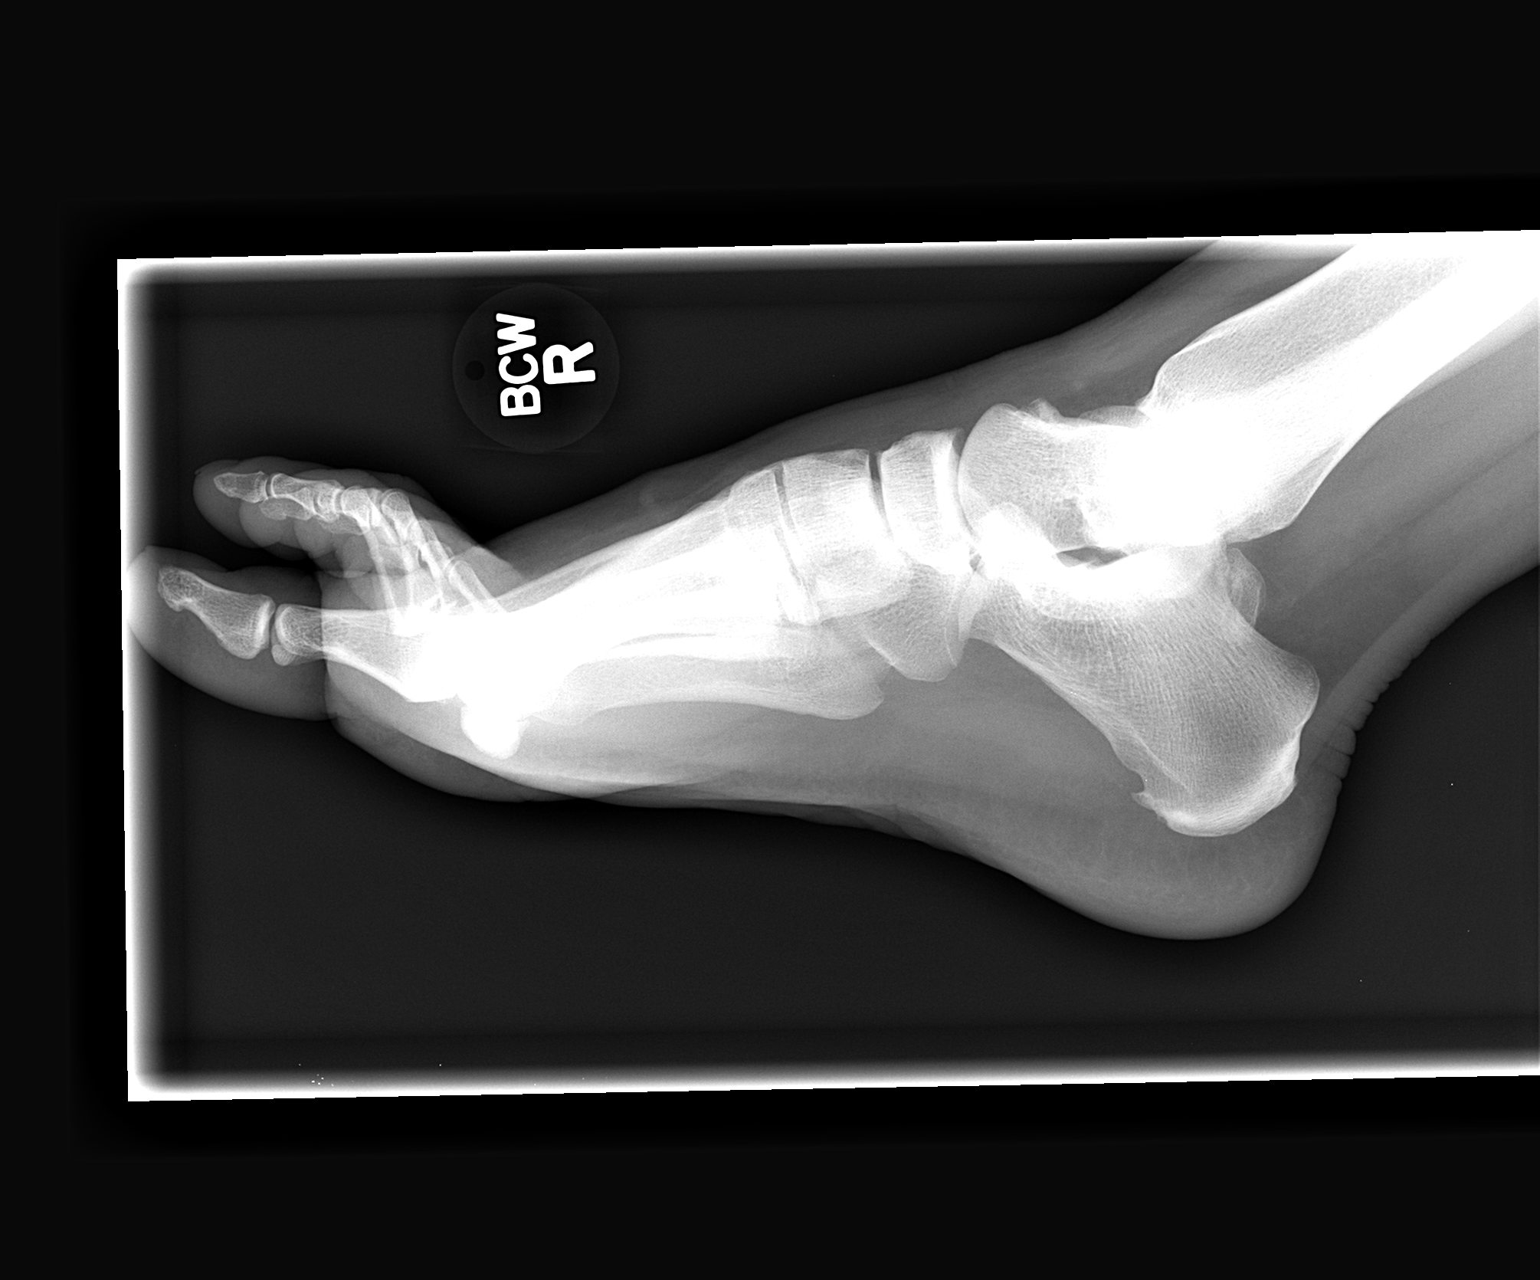

[3 of 3 positions shown; findings below may reference images not displayed]

FINDINGS: The joint spaces are maintained. No acute fracture is identified.
Venedictini Ontina is noted along with a small calcaneal heel spur. Os
trigonum is noted.
IMPRESSION: No acute bony findings.

Venedictini Ontina and small calcaneal heel spur.
